# Patient Record
Sex: Female | Born: 2000 | Race: White | Hispanic: No | Marital: Single | Smoking: Current every day smoker
Health system: Southern US, Community
[De-identification: ages and names within clinical notes are randomized; demographics above are authoritative.]

## PROBLEM LIST (undated history)

## (undated) DIAGNOSIS — O039 Complete or unspecified spontaneous abortion without complication: Secondary | ICD-10-CM

## (undated) HISTORY — PX: TONSILLECTOMY: SUR1361

---
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
## 2023-01-19 ENCOUNTER — Other Ambulatory Visit: Payer: Self-pay

## 2023-01-19 ENCOUNTER — Encounter (HOSPITAL_BASED_OUTPATIENT_CLINIC_OR_DEPARTMENT_OTHER): Payer: Self-pay | Admitting: Emergency Medicine

## 2023-01-19 DIAGNOSIS — L0231 Cutaneous abscess of buttock: Secondary | ICD-10-CM | POA: Insufficient documentation

## 2023-01-19 NOTE — ED Triage Notes (Signed)
Pt POV c/o piloidal abscess first noticed Sunday. Progressively worsening. Pt unable to sit in triage from discomfort.

## 2023-01-20 ENCOUNTER — Emergency Department (HOSPITAL_BASED_OUTPATIENT_CLINIC_OR_DEPARTMENT_OTHER)
Admission: EM | Admit: 2023-01-20 | Discharge: 2023-01-20 | Disposition: A | Discharge status: Home / Self Care | Payer: Medicaid Other | Attending: Emergency Medicine | Admitting: Emergency Medicine

## 2023-01-20 DIAGNOSIS — L0390 Cellulitis, unspecified: Secondary | ICD-10-CM

## 2023-01-20 HISTORY — DX: Complete or unspecified spontaneous abortion without complication: O03.9

## 2023-01-20 MED ORDER — SULFAMETHOXAZOLE-TRIMETHOPRIM 800-160 MG PO TABS
1.0000 | ORAL_TABLET | Freq: Once | ORAL | Status: AC
  Administered 2023-01-20: 1 via ORAL
  Filled 2023-01-20: qty 1

## 2023-01-20 MED ORDER — KETOROLAC TROMETHAMINE 60 MG/2ML IM SOLN
30.0000 mg | Freq: Once | INTRAMUSCULAR | Status: AC
  Administered 2023-01-20: 30 mg via INTRAMUSCULAR
  Filled 2023-01-20: qty 2

## 2023-01-20 MED ORDER — NAPROXEN 500 MG PO TABS: 500.0000 mg | ORAL_TABLET | Freq: Two times a day (BID) | ORAL | 0 refills | Status: AC

## 2023-01-20 MED ORDER — SULFAMETHOXAZOLE-TRIMETHOPRIM 800-160 MG PO TABS: 1.0000 | ORAL_TABLET | Freq: Two times a day (BID) | ORAL | 0 refills | Status: AC

## 2023-01-20 NOTE — ED Provider Notes (Signed)
[place] [facility] Provider Note   CSN: [id number] Arrival date & time: 01/19/23  2305     History  Chief Complaint  Patient presents with   Abscess    Katie Herrera is a 22 y.o. female.  The history is provided by the patient.  Abscess Abscess location: Right buttock. Abscess quality: draining, induration, painful and redness   Duration:  5 days Pain details:    Timing:  Constant   Progression:  Unchanged Context: not diabetes   Relieved by:  Nothing Worsened by:  Nothing Ineffective treatments:  None tried Associated symptoms: no fever   Patient with "pimple" on right buttock that she popped and drained but redness and pain persist.       Home Medications Prior to Admission medications   Not on File      Allergies    Iodinated contrast media    Review of Systems   Review of Systems  Constitutional:  Negative for fever.  HENT:  Negative for drooling.   Eyes:  Negative for photophobia.  Respiratory:  Negative for stridor.   Cardiovascular:  Negative for chest pain.  Skin:  Positive for color change.  All other systems reviewed and are negative.   Physical Exam Updated Vital Signs BP 114/80 (BP Location: Left Arm)   Pulse (!) 118   Temp 98.7 F (37.1 C) (Oral)   Resp 19   Ht 5\' 2"  (1.575 m)   Wt 81.6 kg   LMP 01/05/2023 (Approximate)   SpO2 98%   BMI 32.92 kg/m  Physical Exam Vitals and nursing note reviewed. Exam conducted with a chaperone present.  Constitutional:      Appearance: Normal appearance.  HENT:     Head: Normocephalic and atraumatic.     Nose: Nose normal.  Cardiovascular:     Pulses: Normal pulses.     Heart sounds: Normal heart sounds.  Abdominal:     General: Abdomen is flat. Bowel sounds are normal.  Musculoskeletal:        General: Normal range of motion.     Cervical back: Normal range of motion and neck supple.  Skin:    General: Skin is warm and dry.     Capillary Refill:  Capillary refill takes less than 2 seconds.       Neurological:     Mental Status: She is alert.     ED Results / Procedures / Treatments   Labs (all labs ordered are listed, but only abnormal results are displayed) Labs Reviewed - No data to display  EKG None  Radiology No results found.  Procedures Procedures    Medications Ordered in ED Medications  ketorolac (TORADOL) injection 30 mg (has no administration in time range)  sulfamethoxazole-trimethoprim (BACTRIM DS) 800-160 MG per tablet 1 tablet (has no administration in time range)    ED Course/ Medical Decision Making/ A&P                             Medical Decision Making Patient with pimple that popped on R buttock   Amount and/or Complexity of Data Reviewed Independent Historian: spouse    Details: See above  External Data Reviewed: notes.    Details: Previous notes reviewed   Risk Prescription drug management. Risk Details: On [place], there is no collection at this time.  Will start antibiotics and sitz baths.  Wound care instructions given.  Strict return precautions given.  Final Clinical Impression(s) / ED Diagnoses Final diagnoses:  None   Return for intractable cough, coughing up blood, fevers > 100.4 unrelieved by medication, shortness of breath, intractable vomiting, chest pain, shortness of breath, weakness, numbness, changes in speech, facial asymmetry, abdominal pain, passing out, Inability to tolerate liquids or food, cough, altered mental status or any concerns. No signs of systemic illness or infection. The patient is nontoxic-appearing on exam and vital signs are within normal limits.  I have reviewed the triage vital signs and the nursing notes. Pertinent labs & imaging results that were available during my care of the patient were reviewed by me and considered in my medical decision making (see chart for details). After history, exam, and medical workup I feel the patient has been  appropriately medically screened and is safe for discharge home. Pertinent diagnoses were discussed with the patient. Patient was given return precautions.  Rx / DC Orders ED Discharge Orders     None         Chloeanne Poteet, MD 01/20/23 8592

## 2023-02-10 ENCOUNTER — Encounter: Payer: Self-pay | Admitting: Gastroenterology

## 2023-04-30 ENCOUNTER — Ambulatory Visit: Payer: Medicaid Other | Admitting: Gastroenterology

## 2023-04-30 NOTE — Progress Notes (Deleted)
    [facility] Consult Note:  History: Katie Herrera 04/30/2023  Referring provider: Pcp, No  Reason for consult/chief complaint: No chief complaint on file.   Subjective  HPI: Referred by [facility] ED after April visit for worsening buttock abscess - had been Rx with Bactrim at [facility] shortly before that. I&D performed, Rx clindamycin, given referral to [facility]. ***   ROS:  Review of Systems   Past Medical History: Past Medical History:  Diagnosis Date   Miscarriage      Past Surgical History: Past Surgical History:  Procedure Laterality Date   TONSILLECTOMY       Family History: No family history on file.  Social History: Social History   Socioeconomic History   Marital status: Single    Spouse name: Not on file   Number of children: Not on file   Years of education: Not on file   Highest education level: Not on file  Occupational History   Not on file  Tobacco Use   Smoking status: Every Day    Types: Cigarettes   Smokeless tobacco: Not on file  Substance and Sexual Activity   Alcohol use: Not on file   Drug use: Not on file   Sexual activity: Not on file  Other Topics Concern   Not on file  Social History Narrative   Not on file   Social Determinants of Health   Financial Resource Strain: Not on file  Food Insecurity: Not on file  Transportation Needs: Not on file  Physical Activity: Not on file  Stress: Not on file  Social Connections: Unknown (02/22/2022)   Received from [facility], [facility]   Social Network    Social Network: Not on file    Allergies: Allergies  Allergen Reactions   Iodinated Contrast Media Hives    Outpatient Meds: Current Outpatient Medications  Medication Sig Dispense Refill   naproxen (NAPROSYN) 500 MG tablet Take 1 tablet (500 mg total) by mouth 2 (two) times daily with a meal. 10 tablet 0   No current facility-administered medications for this visit.       ___________________________________________________________________ Objective   Exam:  There were no vitals taken for this visit. Wt Readings from Last 3 Encounters:  01/19/23 180 lb (81.6 kg)  09/03/12 91 lb 6 oz (41.4 kg) (84%, Z= 0.99)*   * Growth percentiles are based on CDC (Girls, 2-20 Years) data.    General: ***  Eyes: sclera anicteric, no redness ENT: oral mucosa moist without lesions, no cervical or supraclavicular lymphadenopathy CV: ***, no JVD, no peripheral edema Resp: clear to auscultation bilaterally, normal RR and effort noted GI: soft, *** tenderness, with active bowel sounds. No guarding or palpable organomegaly noted. Skin; warm and dry, no rash or jaundice noted Neuro: awake, alert and oriented x 3. Normal gross motor function and fluent speech  Labs:  AST 123,  ALT  58,  AP 165,  T bili 0.18 Aug 2020 [facility] LFTs nml except AP 126 May 2022 [facility] LFT[facility] nml June 2023 [facility] LFTs nml except AP 146  Radiologic Studies:  ***  Assessment: No diagnosis found.  ***  Plan:  ***  Thank you for the courtesy of this consult.  Please call me with any questions or concerns.  Charlie Pitter III  CC: Referring provider noted above

## 2023-05-15 ENCOUNTER — Emergency Department (HOSPITAL_BASED_OUTPATIENT_CLINIC_OR_DEPARTMENT_OTHER)
Admission: EM | Admit: 2023-05-15 | Discharge: 2023-05-15 | Disposition: A | Discharge status: Home / Self Care | Payer: Medicaid Other | Source: Home / Self Care | Attending: Emergency Medicine | Admitting: Emergency Medicine

## 2023-05-15 ENCOUNTER — Encounter (HOSPITAL_BASED_OUTPATIENT_CLINIC_OR_DEPARTMENT_OTHER): Payer: Self-pay

## 2023-05-15 ENCOUNTER — Emergency Department (HOSPITAL_BASED_OUTPATIENT_CLINIC_OR_DEPARTMENT_OTHER): Payer: Medicaid Other

## 2023-05-15 ENCOUNTER — Other Ambulatory Visit: Payer: Self-pay

## 2023-05-15 DIAGNOSIS — F17210 Nicotine dependence, cigarettes, uncomplicated: Secondary | ICD-10-CM | POA: Insufficient documentation

## 2023-05-15 DIAGNOSIS — M25531 Pain in right wrist: Secondary | ICD-10-CM | POA: Insufficient documentation

## 2023-05-15 DIAGNOSIS — W010XXA Fall on same level from slipping, tripping and stumbling without subsequent striking against object, initial encounter: Secondary | ICD-10-CM | POA: Diagnosis not present

## 2023-05-15 MED ORDER — IBUPROFEN 600 MG PO TABS: 600.0000 mg | ORAL_TABLET | Freq: Four times a day (QID) | ORAL | 0 refills | Status: AC | PRN

## 2023-05-15 MED ORDER — IBUPROFEN 400 MG PO TABS
600.0000 mg | ORAL_TABLET | Freq: Once | ORAL | Status: AC
  Administered 2023-05-15: 600 mg via ORAL
  Filled 2023-05-15: qty 1

## 2023-05-15 NOTE — ED Provider Notes (Signed)
[place] [facility] Provider Note   CSN: [id number] Arrival date & time: 05/15/23  1030     History  Chief Complaint  Patient presents with   Wrist Pain    Katie Herrera is a 22 y.o. female.   Wrist Pain   22 year old female presents emergency department with complaints of right-sided wrist pain.  Patient states that she suffered mechanical fall 2 days ago when she tripped landing on the backside of her right hand.  Reported having pain and inflammation initially.  States that she is right-handed and uses her right hand throughout the day performing tasks.  States that she woke up this morning and pain was worse prompting visit to the emergency department.  Denies any weakness or sensory deficits in affected hand.  States the pain is worsened with any movement of her right wrist.  Denies any repeat trauma.  Denies any trauma elsewhere during initial fall.  No significant pertinent past medical history.  Home Medications Prior to Admission medications   Medication Sig Start Date End Date Taking? Authorizing Provider  ibuprofen (ADVIL) 600 MG tablet Take 1 tablet (600 mg total) by mouth every 6 (six) hours as needed. 05/15/23  Yes Sherian Maroon A, PA  naproxen (NAPROSYN) 500 MG tablet Take 1 tablet (500 mg total) by mouth 2 (two) times daily with a meal. 01/20/23   Palumbo, April, MD      Allergies    Iodinated contrast media    Review of Systems   Review of Systems  All other systems reviewed and are negative.   Physical Exam Updated Vital Signs BP 120/75 (BP Location: Left Arm)   Pulse 73   Temp 98.2 F (36.8 C) (Oral)   Resp 16   Ht 5\' 2"  (1.575 m)   Wt 81.6 kg   LMP 02/19/2023 (Exact Date)   SpO2 100%   BMI 32.92 kg/m  Physical Exam Vitals and nursing note reviewed.  Constitutional:      General: She is not in acute distress.    Appearance: She is well-developed.  HENT:     Head: Normocephalic and atraumatic.  Eyes:      Conjunctiva/sclera: Conjunctivae normal.  Cardiovascular:     Rate and Rhythm: Normal rate and regular rhythm.     Heart sounds: No murmur heard. Pulmonary:     Effort: Pulmonary effort is normal. No respiratory distress.     Breath sounds: Normal breath sounds.  Abdominal:     Palpations: Abdomen is soft.     Tenderness: There is no abdominal tenderness.  Musculoskeletal:        General: No swelling.     Cervical back: Neck supple.     Comments: Patient with full range of motion of right wrist but with pain during extension.  Tender to palpation along 1 through 3 metacarpals as well as distally of the radius and ulna.  No overlying skin abnormalities including erythema, palpable fluctuance/induration.  Mild swelling noted on the dorsal aspect of patient's right hand/wrist.  Tenderness to palpation of right anatomical snuffbox as well as pain with axial loading of the thumb.  No sensory deficits distally in fingers.  Radial pulses 2+ bilaterally.  Cap refill less than 2 seconds.  Skin:    General: Skin is warm and dry.     Capillary Refill: Capillary refill takes less than 2 seconds.  Neurological:     Mental Status: She is alert.  Psychiatric:  Mood and Affect: Mood normal.     ED Results / Procedures / Treatments   Labs (all labs ordered are listed, but only abnormal results are displayed) Labs Reviewed - No data to display  EKG None  Radiology DG Wrist Complete Right  Result Date: 05/15/2023 CLINICAL DATA:  Larey Seat and hurt right wrist 2 days ago. EXAM: RIGHT WRIST - COMPLETE 3+ VIEW; RIGHT HAND - COMPLETE 3+ VIEW COMPARISON:  None Available. FINDINGS: Right wrist: There is moderate medial downsloping of the distal radius, likely congenital. Neutral ulnar variance. Joint spaces are preserved. No acute fracture or dislocation. Right hand: Normal bone mineralization. Joint spaces are preserved. No acute fracture or dislocation. The cortices are intact. IMPRESSION: 1. No acute  fracture or dislocation of the right wrist or hand. 2. 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 Electronically Signed   By: Neita Garnet M.D.   On: 05/15/2023 12:11   DG Hand Complete Right  Result Date: 05/15/2023 CLINICAL DATA:  Larey Seat and hurt right wrist 2 days ago. EXAM: RIGHT WRIST - COMPLETE 3+ VIEW; RIGHT HAND - COMPLETE 3+ VIEW COMPARISON:  None Available. FINDINGS: Right wrist: There is moderate medial downsloping of the distal radius, likely congenital. Neutral ulnar variance. Joint spaces are preserved. No acute fracture or dislocation. Right hand: Normal bone mineralization. Joint spaces are preserved. No acute fracture or dislocation. The cortices are intact. IMPRESSION: 1. No acute fracture or dislocation of the right wrist or hand. 2. 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 Electronically Signed   By: Neita Garnet M.D.   On: 05/15/2023 12:11    Procedures Procedures    Medications Ordered in ED Medications  ibuprofen (ADVIL) tablet 600 mg (600 mg Oral Given 05/15/23 1228)    ED Course/ Medical Decision Making/ A&P Clinical Course as of 05/15/23 1308  Sat May 15, 2023  1216 DG Hand Complete Right [CR]    Clinical Course User Index [CR] Peter Garter, PA                                 Medical Decision Making Amount and/or Complexity of Data Reviewed Radiology: ordered. Decision-making details documented in ED Course.  Risk Prescription drug management.   This patient presents to the ED for concern of wrist pain, this involves an extensive number of treatment options, and is a complaint that carries with it a high risk of complications and morbidity.  The differential diagnosis includes fracture, dislocation, strain/sprain, ligamentous/tendinous injury, neurovascular compromise, cellulitis, erysipelas, necrotizing fasciitis   Co morbidities that complicate the patient evaluation  See HPI   Additional  history obtained:  Additional history obtained from EMR External records from outside source obtained and reviewed including hospital records   Lab Tests:  N/a   Imaging Studies ordered:  I ordered imaging studies including right hand/wrist x-ray I independently visualized and interpreted imaging which showed no acute fracture or dislocation of right hand/wrist.  Moderate medial downsloping of distal radius likely congenital I agree with the radiologist interpretation  Cardiac Monitoring: / EKG:  The patient was maintained on a cardiac monitor.  I personally viewed and interpreted the cardiac monitored which showed an underlying rhythm of: Sinus rhythm   Consultations Obtained:  N/a   Problem List / ED Course / Critical interventions / Medication management  Right wrist pain I ordered medication including Motrin   Reevaluation of the patient after  these medicines showed that the patient improved I have reviewed the patients home medicines and have made adjustments as needed   Social Determinants of Health:  Chronic cigarette use.  Denies illicit drug use.   Test / Admission - Considered:  Right wrist pain Vitals signs within normal range and stable throughout visit. Imaging studies significant for: See above 21 year old female presents emergency department with complaints of right-sided wrist pain after mechanical fall 2 days ago.  On exam, patient with diffuse tenderness on the dorsal lateral aspect of right hand with confirming anatomical snuffbox tenderness as well as pain with axial loading of thumb.  X-ray imaging was obtained which was negative for any acute abnormality.  Somewhat suspicious for underlying scaphoid fracture.  Placed in wrist brace with thumb abduction and will recommend follow-up with hand specialist the outpatient setting for reevaluation.  No indication for secondary infectious process given lack of clinical findings.  No evidence of neurovascular  compromise.  Will recommend symptomatic treatment at home with rest, ice, elevation, NSAIDs.  Treatment plan discussed at length with patient and she acknowledged understanding was agreeable to the plan.  Patient overall well-appearing, afebrile in no acute distress. Worrisome signs and symptoms were discussed with the patient, and the patient acknowledged understanding to return to the ED if noticed. Patient was stable upon discharge.          Final Clinical Impression(s) / ED Diagnoses Final diagnoses:  Right wrist pain    Rx / DC Orders ED Discharge Orders          Ordered    ibuprofen (ADVIL) 600 MG tablet  Every 6 hours PRN        05/15/23 1232              Peter Garter, [place] 05/15/23 1308    Alvira Monday, MD 05/15/23 2245

## 2023-05-15 NOTE — Discharge Instructions (Signed)
As discussed, x-rays were negative for any fracture or dislocation.  I do suspect he may have an underlying fracture not seen on x-ray imaging given where you are tender.  Will place you in a wrist brace and recommend keeping on until follow-up with orthopedics in the outpatient setting.  Attached is number to call to schedule an appointment.  Recommend rest, ice, elevation, medication in the form of Motrin/ibuprofen take as needed for pain/inflammation.  Please do not hesitate to return to emergency department if the worrisome signs and symptoms we discussed become apparent.

## 2023-05-15 NOTE — ED Triage Notes (Signed)
Patient stated two days ago she fell and hurt her right wrist.
